# Patient Record
Sex: Female | Born: 1975 | Race: Black or African American | Hispanic: No | Marital: Married | Smoking: Never smoker
Health system: Southern US, Community
[De-identification: ages and names within clinical notes are randomized; demographics above are authoritative.]

---

## 2017-12-26 ENCOUNTER — Ambulatory Visit
Admission: RE | Admit: 2017-12-26 | Discharge: 2017-12-26 | Disposition: A | Discharge status: Home / Self Care | Payer: Managed Care, Other (non HMO) | Source: Ambulatory Visit | Attending: Internal Medicine | Admitting: Internal Medicine

## 2017-12-26 ENCOUNTER — Other Ambulatory Visit: Payer: Self-pay | Admitting: Internal Medicine

## 2017-12-26 ENCOUNTER — Ambulatory Visit
Admission: RE | Admit: 2017-12-26 | Discharge: 2017-12-26 | Disposition: A | Discharge status: Home / Self Care | Payer: Managed Care, Other (non HMO) | Attending: Internal Medicine | Admitting: Internal Medicine

## 2017-12-26 ENCOUNTER — Encounter (INDEPENDENT_AMBULATORY_CARE_PROVIDER_SITE_OTHER): Payer: Self-pay

## 2017-12-26 DIAGNOSIS — M545 Low back pain, unspecified: Secondary | ICD-10-CM

## 2019-02-21 IMAGING — CR DG LUMBAR SPINE COMPLETE 4+V
5 series · 5 of 5 positions shown · non-contrast
Comparison: None.

CLINICAL DATA: One-week history of low back pain radiating into the
RIGHT LOWER extremity. No recent injuries.

EXAM:
LUMBAR SPINE - COMPLETE 4+ VIEW

[l-spine ap]
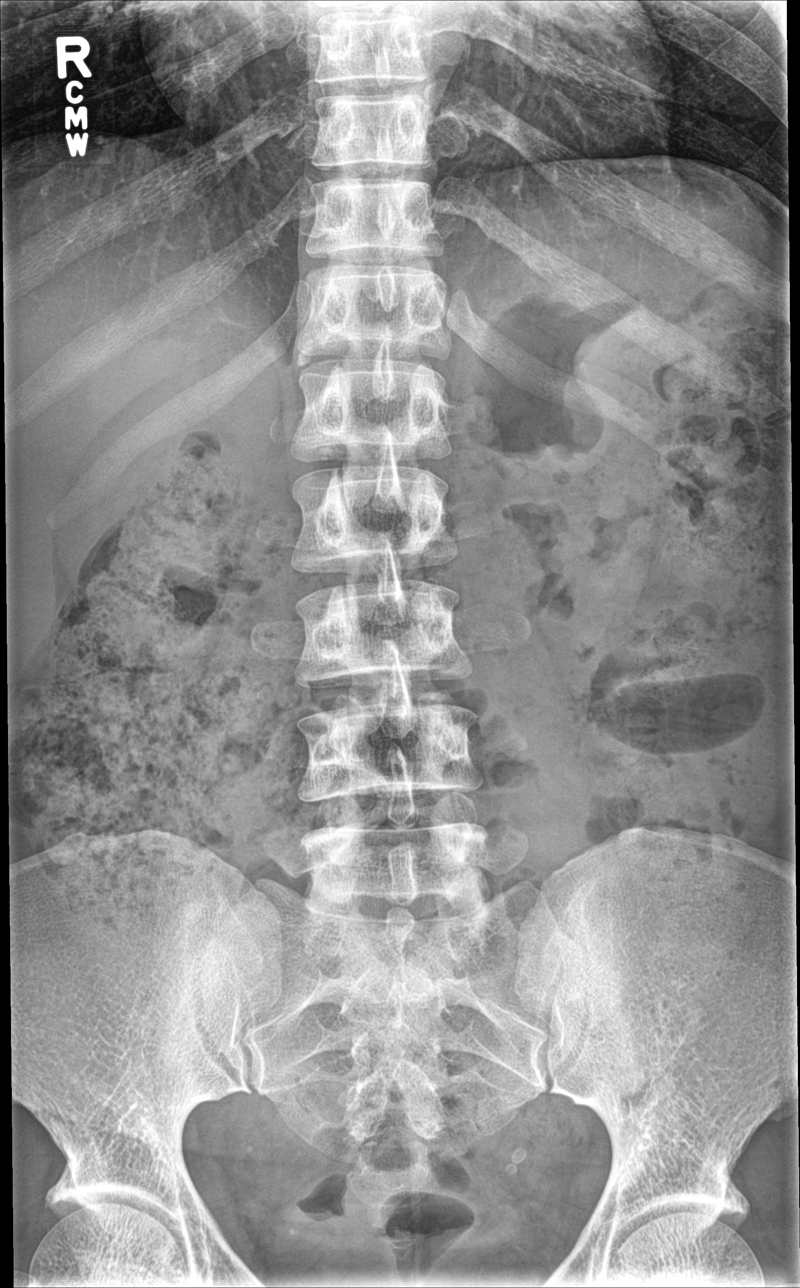

[l-spine obl (1 of 2)]
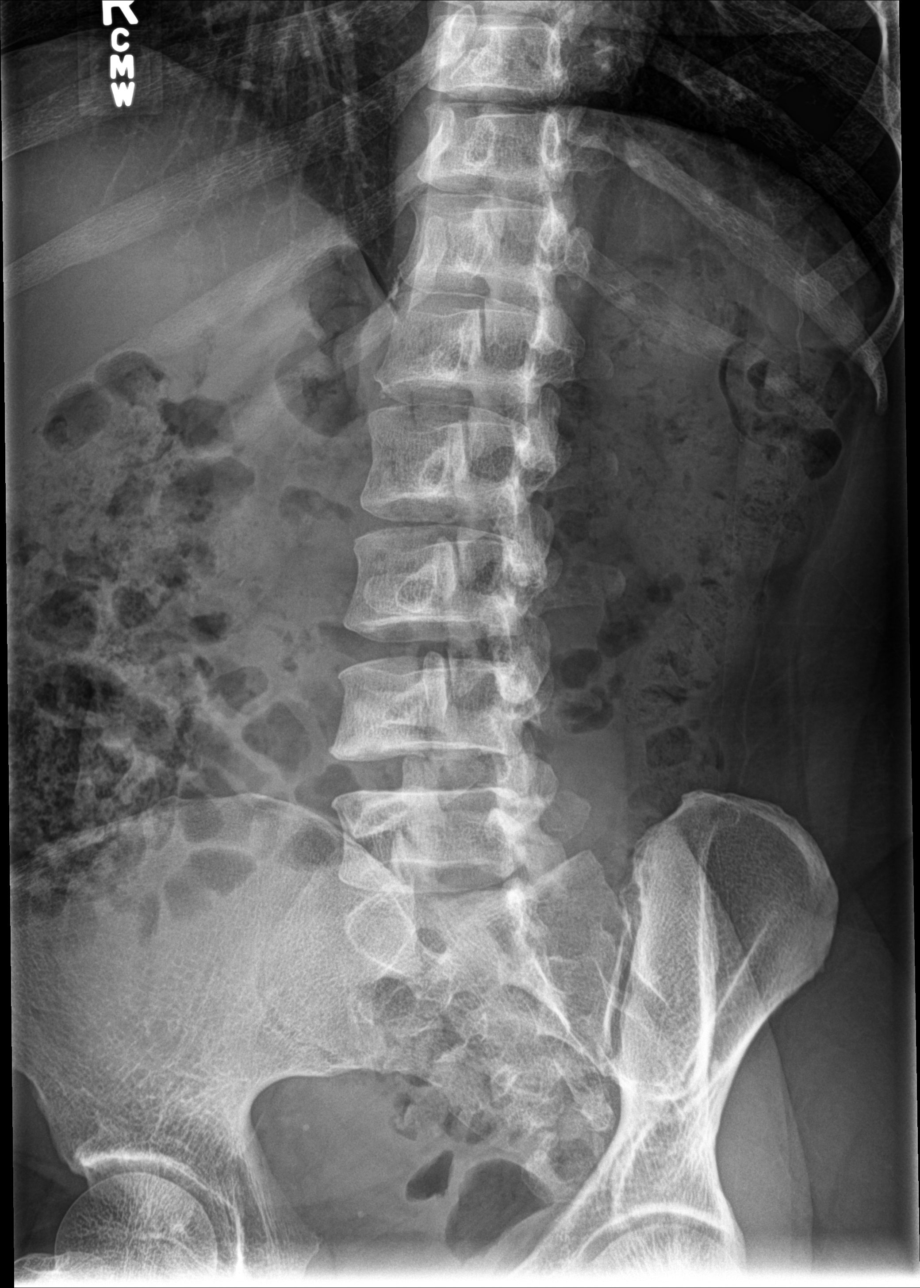

[l-spine obl (2 of 2)]
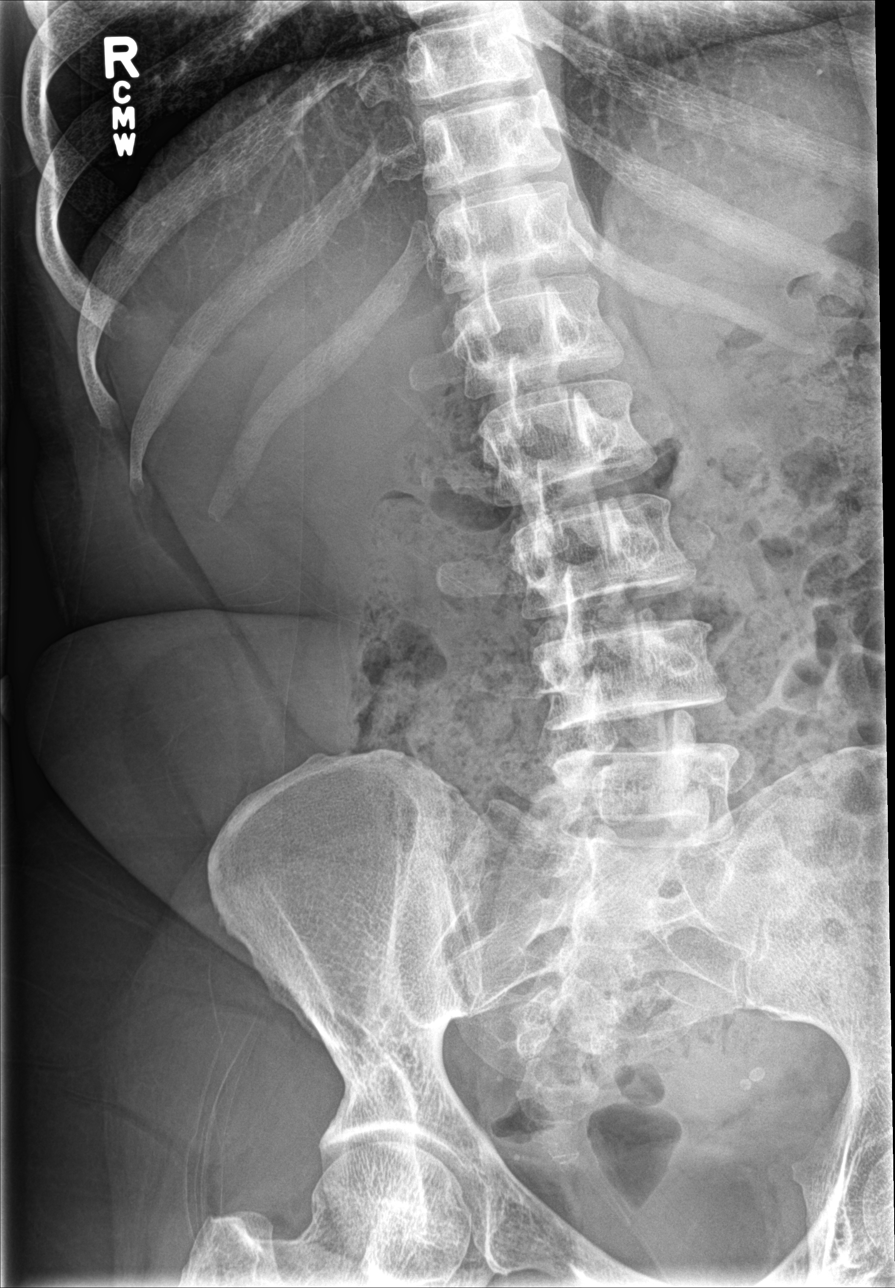

[l-spine lat]
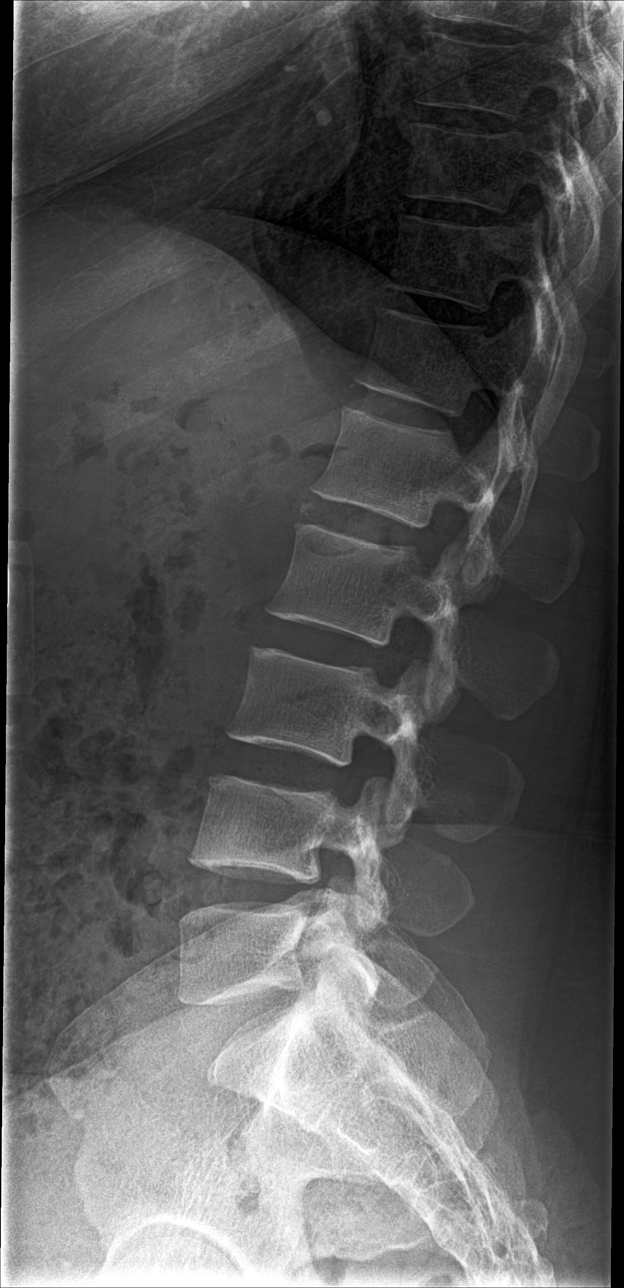

[l-spine spot]
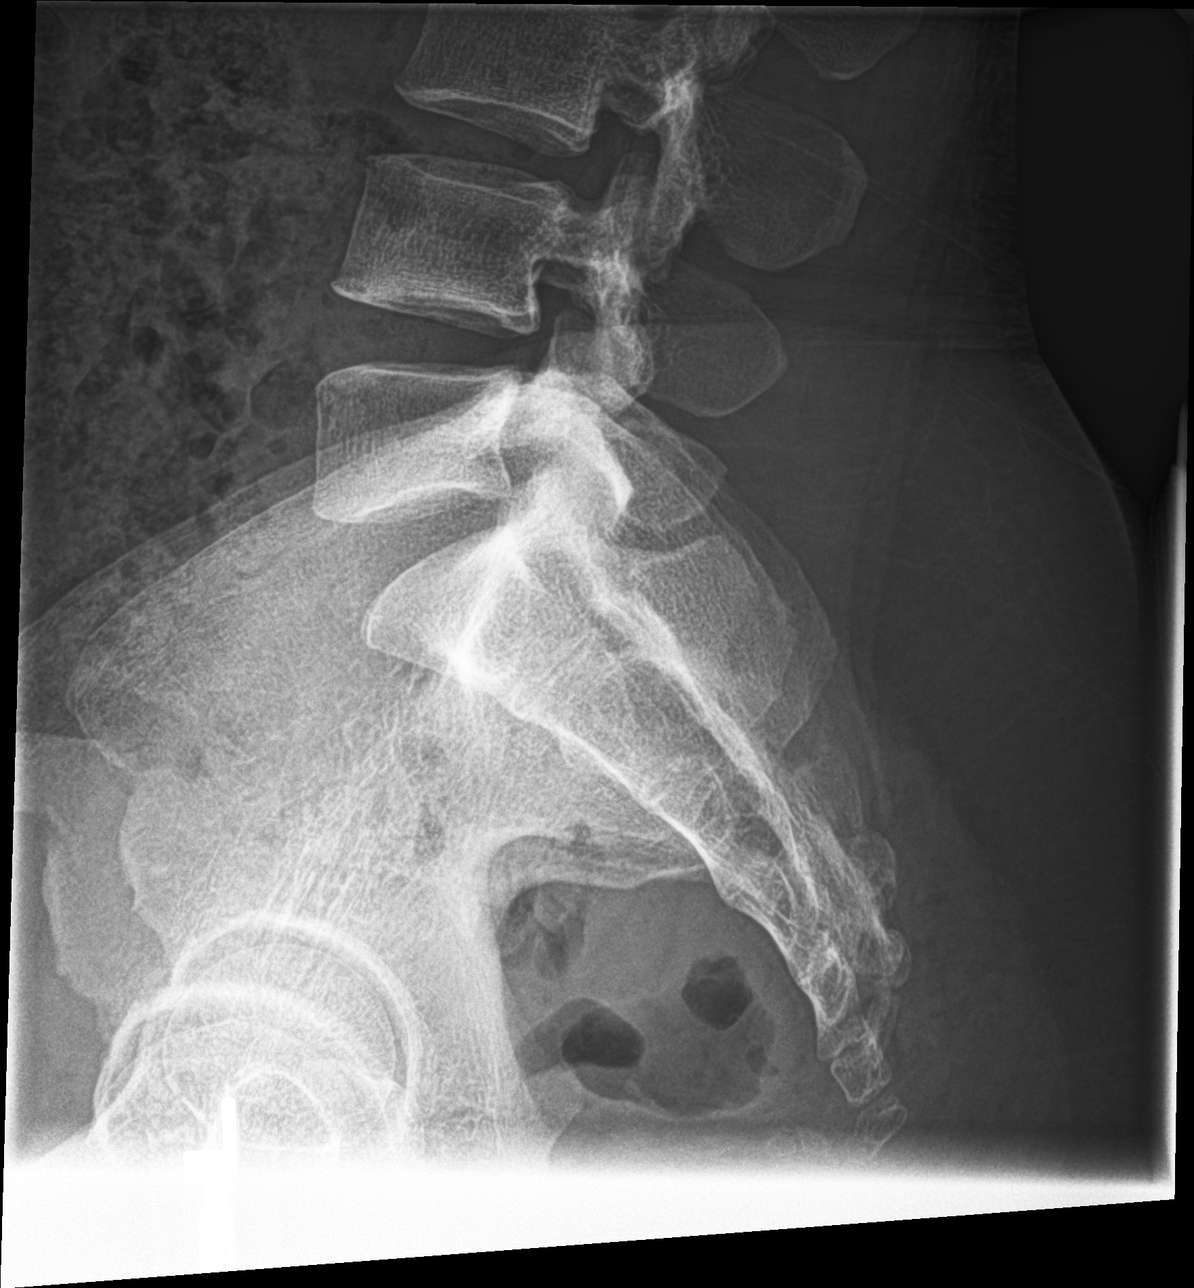

[5 of 5 positions shown; findings below may reference images not displayed]

FINDINGS: Five non-rib-bearing lumbar vertebrae with anatomic alignment. No
fractures. Well-preserved disc spaces. Calcification in the ANTERIOR
annular fibers at L1-2. No pars defects or significant facet
arthropathy. Sacroiliac joints intact without degenerative changes.
IMPRESSION: No significant abnormality.

## 2021-12-27 ENCOUNTER — Ambulatory Visit
Admission: EM | Admit: 2021-12-27 | Discharge: 2021-12-27 | Disposition: A | Discharge status: Home / Self Care | Payer: 59 | Attending: Family Medicine | Admitting: Family Medicine

## 2021-12-27 ENCOUNTER — Encounter: Payer: Self-pay | Admitting: Emergency Medicine

## 2021-12-27 DIAGNOSIS — Z1152 Encounter for screening for COVID-19: Secondary | ICD-10-CM | POA: Insufficient documentation

## 2021-12-27 DIAGNOSIS — J111 Influenza due to unidentified influenza virus with other respiratory manifestations: Secondary | ICD-10-CM | POA: Diagnosis present

## 2021-12-27 DIAGNOSIS — R030 Elevated blood-pressure reading, without diagnosis of hypertension: Secondary | ICD-10-CM | POA: Diagnosis present

## 2021-12-27 LAB — RESP PANEL BY RT-PCR (RSV, FLU A&B, COVID)  RVPGX2
Influenza A by PCR: POSITIVE — AB
Influenza B by PCR: NEGATIVE
Resp Syncytial Virus by PCR: NEGATIVE
SARS Coronavirus 2 by RT PCR: NEGATIVE

## 2021-12-27 MED ORDER — ALBUTEROL SULFATE HFA 108 (90 BASE) MCG/ACT IN AERS: 2.0000 | INHALATION_SPRAY | RESPIRATORY_TRACT | 0 refills | Status: AC | PRN

## 2021-12-27 MED ORDER — PROMETHAZINE-DM 6.25-15 MG/5ML PO SYRP: 5.0000 mL | ORAL_SOLUTION | Freq: Four times a day (QID) | ORAL | 0 refills | Status: AC | PRN

## 2021-12-27 NOTE — ED Triage Notes (Signed)
Pt c/o cough, body aches, headache. Started about 3 days ago. Denies fever.

## 2021-12-27 NOTE — Discharge Instructions (Signed)
We will contact you if your COVID/influenza/RSV test is positive.  Please quarantine while you wait for the results.  If your test is negative you may resume normal activities.  If your test is positive please continue to quarantine for at least 5 days from your symptom onset or until you are without a fever for at least 24 hours after the medications.    If your were prescribed medication, stop by the pharmacy to pick them up.   You can take Tylenol and/or Ibuprofen as needed for fever reduction and pain relief.    For cough: honey 1/2 to 1 teaspoon (you can dilute the honey in water or another fluid).  You can also use guaifenesin and dextromethorphan for cough. You can use a humidifier for chest congestion and cough.  If you don't have a humidifier, you can sit in the bathroom with the hot shower running.      For sore throat: try warm salt water gargles, Mucinex sore throat cough drops or cepacol lozenges, throat spray, warm tea or water with lemon/honey, popsicles or ice, or OTC cold relief medicine for throat discomfort. You can also purchase chloraseptic spray at the pharmacy or dollar store.   For congestion: take a daily anti-histamine like Zyrtec, Claritin, and a oral decongestant, such as pseudoephedrine.  You can also use Flonase 1-2 sprays in each nostril daily. Afrin is also a good option, if you do not have high blood pressure.    It is important to stay hydrated: drink plenty of fluids (water, gatorade/powerade/pedialyte, juices, or teas) to keep your throat moisturized and help further relieve irritation/discomfort.    Return or go to the Emergency Department if symptoms worsen or do not improve in the next few days  

## 2021-12-27 NOTE — ED Provider Notes (Signed)
[facility]    CSN: [id number] Arrival date & time: 12/27/21  1006      History   Chief Complaint Chief Complaint  Patient presents with   Cough   Generalized Body Aches    HPI Tonya Bean is a 46 y.o. female.   HPI   Tonya Bean presents for sore throat, body aches, headache, nasal congestion and cough that started on Monday. Has been taking Robitussin and Tylenol for the pain. She felt like she was wheezing last night.  No fever, diarrhea, vomiting, nausea, belly pain, shortness of breath. Has intermittent chest tightness. No tobacco use or history of asthma.  No one else has similar sx.  Has children in high school.     History reviewed. No pertinent past medical history.  There are no problems to display for this patient.   History reviewed. No pertinent surgical history.  OB History   No obstetric history on file.      Home Medications    Prior to Admission medications   Medication Sig Start Date End Date Taking? Authorizing Provider  albuterol (VENTOLIN HFA) 108 (90 Base) MCG/ACT inhaler Inhale 2 puffs into the lungs every 4 (four) hours as needed. 12/27/21  Yes Shantea Poulton, DO  promethazine-dextromethorphan (PROMETHAZINE-DM) 6.25-15 MG/5ML syrup Take 5 mLs by mouth 4 (four) times daily as needed. 12/27/21  Yes Katha Cabal, DO    Family History History reviewed. No pertinent family history.  Social History Social History   Tobacco Use   Smoking status: Never   Smokeless tobacco: Never  Vaping Use   Vaping Use: Never used  Substance Use Topics   Alcohol use: Never   Drug use: Never     Allergies   Patient has no known allergies.   Review of Systems Review of Systems: negative unless otherwise stated in HPI.      Physical Exam Triage Vital Signs ED Triage Vitals  Enc Vitals Group     BP      Pulse      Resp      Temp      Temp src      SpO2      Weight      Height      Head Circumference      Peak Flow       Pain Score      Pain Loc      Pain Edu?      Excl. in GC?    No data found.  Updated Vital Signs BP (!) 150/96 (BP Location: Right Arm)   Pulse 96   Temp 99.5 F (37.5 C) (Oral)   Resp 16   Ht 4\' 11"  (1.499 m)   Wt 86 kg   LMP 12/22/2021 (Approximate)   SpO2 99%   BMI 38.29 kg/m   Visual Acuity Right Eye Distance:   Left Eye Distance:   Bilateral Distance:    Right Eye Near:   Left Eye Near:    Bilateral Near:     Physical Exam GEN:     alert, non-toxic appearing female in no distress    HENT:  mucus membranes moist, oropharyngeal without lesions or exudate, no tonsillar hypertrophy,  mild oropharyngeal erythema, clear nasal discharge, bilateral TM normal EYES:   pupils equal and reactive, no scleral injection or discharge NECK:  normal ROM, no lymphadenopathy, no meningismus   RESP:  no increased work of breathing, clear to auscultation bilaterally CVS:   regular rate and rhythm  Skin:   warm and dry, no rash on visible skin    UC Treatments / Results  Labs (all labs ordered are listed, but only abnormal results are displayed) Labs Reviewed  RESP PANEL BY RT-PCR (RSV, FLU A&B, COVID)  RVPGX2 - Abnormal; Notable for the following components:      Result Value   Influenza A by PCR POSITIVE (*)    All other components within normal limits    EKG   Radiology No results found.  Procedures Procedures (including critical care time)  Medications Ordered in UC Medications - No data to display  Initial Impression / Assessment and Plan / UC Course  I have reviewed the triage vital signs and the nursing notes.  Pertinent labs & imaging results that were available during my care of the patient were reviewed by me and considered in my medical decision making (see chart for details).       Pt is a 46 y.o. female who presents for 2-3 days of respiratory symptoms. Tonya Bean is afebrile here without recent antipyretics. She works as a Clinical biochemist at [employer].  [place]  well on room air. Overall pt is non-toxic appearing, well hydrated, without respiratory distress. Pulmonary exam is unremarkable.  COVID, RSV and influenza testing obtained. Pt to quarantine until COVID test results or longer if positive.  I will call patient with test results, if positive. History consistent with viral respiratory illness. Discussed symptomatic treatment.  Promethazine DM prescribed for cough and albuterol for bronchospasm.  Explained lack of efficacy of antibiotics in viral disease.  Typical duration of symptoms discussed.   COVID and RSV were negative however she was influenza A positive. Pt called and updated with results. All questions asked were answered. Work note provided.   She is hypertensive 160-170/100s.  Repeat BP 156/96 after sitting for 10 minutes. On chart review, her blood pressures have been elevated in the 160s-150 systolically in the past.  This was at her most recent with her PCP.  She may have undiagnosed hypertension. Pt advised to get a blood pressure cuff and take your blood pressure first thing in the morning for the next 2 weeks.  Follow-up with her PCP for consideration of blood pressure medications.    Return and ED precautions given and voiced understanding. Discussed MDM, treatment plan and plan for follow-up with patient who agrees with plan.     Final Clinical Impressions(s) / UC Diagnoses   Final diagnoses:  Influenza-like illness  Elevated blood pressure reading without diagnosis of hypertension     Discharge Instructions      We will contact you if your COVID/influenza/RSV test is positive.  Please quarantine while you wait for the results.  If your test is negative you may resume normal activities.  If your test is positive please continue to quarantine for at least 5 days from your symptom onset or until you are without a fever for at least 24 hours after the medications.    If your were prescribed medication, stop by the pharmacy to pick  them up.   You can take Tylenol and/or Ibuprofen as needed for fever reduction and pain relief.    For cough: honey 1/2 to 1 teaspoon (you can dilute the honey in water or another fluid).  You can also use guaifenesin and dextromethorphan for cough. You can use a humidifier for chest congestion and cough.  If you don't have a humidifier, you can sit in the bathroom with the hot shower running.  For sore throat: try warm salt water gargles, Mucinex sore throat cough drops or cepacol lozenges, throat spray, warm tea or water with lemon/honey, popsicles or ice, or OTC cold relief medicine for throat discomfort. You can also purchase chloraseptic spray at the pharmacy or dollar store.   For congestion: take a daily anti-histamine like Zyrtec, Claritin, and a oral decongestant, such as pseudoephedrine.  You can also use Flonase 1-2 sprays in each nostril daily. Afrin is also a good option, if you do not have high blood pressure.    It is important to stay hydrated: drink plenty of fluids (water, gatorade/powerade/pedialyte, juices, or teas) to keep your throat moisturized and help further relieve irritation/discomfort.    Return or go to the Emergency Department if symptoms worsen or do not improve in the next few days      ED Prescriptions     Medication Sig Dispense Auth. Provider   promethazine-dextromethorphan (PROMETHAZINE-DM) 6.25-15 MG/5ML syrup Take 5 mLs by mouth 4 (four) times daily as needed. 118 mL Irish Piech, DO   albuterol (VENTOLIN HFA) 108 (90 Base) MCG/ACT inhaler Inhale 2 puffs into the lungs every 4 (four) hours as needed. 6.7 g Katha Cabal, DO      PDMP not reviewed this encounter.   Katha Cabal, DO 12/27/21 1319
